# Patient Record
Sex: Male | Born: 1980 | Race: Black or African American | Hispanic: No | Marital: Single | Smoking: Current every day smoker
Health system: Southern US, Community
[De-identification: ages and names within clinical notes are randomized; demographics above are authoritative.]

## PROBLEM LIST (undated history)

## (undated) DIAGNOSIS — S0990XA Unspecified injury of head, initial encounter: Secondary | ICD-10-CM

## (undated) HISTORY — PX: BRAIN SURGERY: SHX531

---

## 2017-03-21 ENCOUNTER — Emergency Department (HOSPITAL_COMMUNITY)
Admission: EM | Admit: 2017-03-21 | Discharge: 2017-03-21 | Disposition: A | Discharge status: Home / Self Care | Payer: No Typology Code available for payment source | Attending: Emergency Medicine | Admitting: Emergency Medicine

## 2017-03-21 ENCOUNTER — Encounter (HOSPITAL_COMMUNITY): Payer: Self-pay | Admitting: Emergency Medicine

## 2017-03-21 ENCOUNTER — Emergency Department (HOSPITAL_COMMUNITY): Payer: No Typology Code available for payment source

## 2017-03-21 ENCOUNTER — Emergency Department (HOSPITAL_COMMUNITY)
Admission: EM | Admit: 2017-03-21 | Discharge: 2017-03-21 | Disposition: A | Discharge status: Home / Self Care | Payer: No Typology Code available for payment source | Source: Home / Self Care

## 2017-03-21 DIAGNOSIS — Y999 Unspecified external cause status: Secondary | ICD-10-CM | POA: Insufficient documentation

## 2017-03-21 DIAGNOSIS — Y9389 Activity, other specified: Secondary | ICD-10-CM | POA: Insufficient documentation

## 2017-03-21 DIAGNOSIS — Z202 Contact with and (suspected) exposure to infections with a predominantly sexual mode of transmission: Secondary | ICD-10-CM | POA: Diagnosis not present

## 2017-03-21 DIAGNOSIS — R51 Headache: Secondary | ICD-10-CM | POA: Insufficient documentation

## 2017-03-21 DIAGNOSIS — S161XXA Strain of muscle, fascia and tendon at neck level, initial encounter: Secondary | ICD-10-CM | POA: Diagnosis not present

## 2017-03-21 DIAGNOSIS — Z5321 Procedure and treatment not carried out due to patient leaving prior to being seen by health care provider: Secondary | ICD-10-CM

## 2017-03-21 DIAGNOSIS — F17210 Nicotine dependence, cigarettes, uncomplicated: Secondary | ICD-10-CM | POA: Diagnosis not present

## 2017-03-21 DIAGNOSIS — S199XXA Unspecified injury of neck, initial encounter: Secondary | ICD-10-CM | POA: Diagnosis present

## 2017-03-21 DIAGNOSIS — Y929 Unspecified place or not applicable: Secondary | ICD-10-CM | POA: Insufficient documentation

## 2017-03-21 HISTORY — DX: Unspecified injury of head, initial encounter: S09.90XA

## 2017-03-21 MED ORDER — NAPROXEN 500 MG PO TABS: 500.0000 mg | ORAL_TABLET | Freq: Two times a day (BID) | ORAL | 0 refills | Status: DC

## 2017-03-21 MED ORDER — CYCLOBENZAPRINE HCL 10 MG PO TABS
10.0000 mg | ORAL_TABLET | Freq: Once | ORAL | Status: AC
  Administered 2017-03-21: 10 mg via ORAL
  Filled 2017-03-21: qty 1

## 2017-03-21 MED ORDER — OXYCODONE-ACETAMINOPHEN 5-325 MG PO TABS
1.0000 | ORAL_TABLET | ORAL | Status: DC | PRN
  Administered 2017-03-21: 1 via ORAL
  Filled 2017-03-21: qty 1

## 2017-03-21 MED ORDER — CEFTRIAXONE SODIUM 250 MG IJ SOLR
250.0000 mg | Freq: Once | INTRAMUSCULAR | Status: AC
  Administered 2017-03-21: 250 mg via INTRAMUSCULAR
  Filled 2017-03-21: qty 250

## 2017-03-21 MED ORDER — IBUPROFEN 200 MG PO TABS
600.0000 mg | ORAL_TABLET | Freq: Once | ORAL | Status: AC
  Administered 2017-03-21: 600 mg via ORAL
  Filled 2017-03-21: qty 3

## 2017-03-21 MED ORDER — STERILE WATER FOR INJECTION IJ SOLN
INTRAMUSCULAR | Status: AC
  Administered 2017-03-21: 10 mL
  Filled 2017-03-21: qty 10

## 2017-03-21 MED ORDER — CYCLOBENZAPRINE HCL 10 MG PO TABS: 10.0000 mg | ORAL_TABLET | Freq: Two times a day (BID) | ORAL | 0 refills | Status: AC | PRN

## 2017-03-21 MED ORDER — AZITHROMYCIN 250 MG PO TABS
1000.0000 mg | ORAL_TABLET | Freq: Once | ORAL | Status: AC
Start: 2017-03-21 — End: 2017-03-21
  Administered 2017-03-21: 1000 mg via ORAL
  Filled 2017-03-21: qty 4
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
## 2017-03-21 NOTE — ED Notes (Signed)
Patient reports he has a ride home. 

## 2017-03-21 NOTE — Discharge Instructions (Signed)
Do not drive while taking the muscle relaxant as it will make you sleepy.  Follow up with the health department for additional STD screening.
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
## 2017-03-22 LAB — HIV ANTIBODY (ROUTINE TESTING W REFLEX): HIV Screen 4th Generation wRfx: NONREACTIVE

## 2017-03-22 LAB — RPR: RPR: NONREACTIVE

## 2017-03-23 LAB — GC/CHLAMYDIA PROBE AMP (~~LOC~~) NOT AT ARMC
CHLAMYDIA, DNA PROBE: NEGATIVE
Neisseria Gonorrhea: NEGATIVE

## 2017-05-05 ENCOUNTER — Emergency Department (HOSPITAL_COMMUNITY): Payer: Self-pay

## 2017-05-05 ENCOUNTER — Emergency Department (HOSPITAL_COMMUNITY)
Admission: EM | Admit: 2017-05-05 | Discharge: 2017-05-05 | Disposition: A | Discharge status: Home / Self Care | Payer: Self-pay | Attending: Emergency Medicine | Admitting: Emergency Medicine

## 2017-05-05 ENCOUNTER — Encounter (HOSPITAL_COMMUNITY): Payer: Self-pay

## 2017-05-05 ENCOUNTER — Other Ambulatory Visit: Payer: Self-pay

## 2017-05-05 DIAGNOSIS — Y929 Unspecified place or not applicable: Secondary | ICD-10-CM | POA: Insufficient documentation

## 2017-05-05 DIAGNOSIS — Y939 Activity, unspecified: Secondary | ICD-10-CM | POA: Insufficient documentation

## 2017-05-05 DIAGNOSIS — M7581 Other shoulder lesions, right shoulder: Secondary | ICD-10-CM

## 2017-05-05 DIAGNOSIS — S46911A Strain of unspecified muscle, fascia and tendon at shoulder and upper arm level, right arm, initial encounter: Secondary | ICD-10-CM | POA: Insufficient documentation

## 2017-05-05 DIAGNOSIS — Y999 Unspecified external cause status: Secondary | ICD-10-CM | POA: Insufficient documentation

## 2017-05-05 DIAGNOSIS — X500XXA Overexertion from strenuous movement or load, initial encounter: Secondary | ICD-10-CM | POA: Insufficient documentation

## 2017-05-05 DIAGNOSIS — M25711 Osteophyte, right shoulder: Secondary | ICD-10-CM | POA: Insufficient documentation

## 2017-05-05 MED ORDER — DICLOFENAC SODIUM 75 MG PO TBEC: 75.0000 mg | DELAYED_RELEASE_TABLET | Freq: Two times a day (BID) | ORAL | 0 refills | Status: AC

## 2017-05-05 MED ORDER — DICLOFENAC SODIUM 75 MG PO TBEC
75.0000 mg | DELAYED_RELEASE_TABLET | Freq: Once | ORAL | Status: DC
  Filled 2017-05-05: qty 1

## 2017-05-05 NOTE — ED Notes (Signed)
Bed: WTR8 Expected date:  Expected time:  Means of arrival:  Comments: 

## 2017-05-05 NOTE — Discharge Instructions (Signed)
You have a bone spur at your ac joint.  Schedule to see the Orthopaedist for evaluation.

## 2017-05-05 NOTE — ED Triage Notes (Signed)
Patient presents with right shoulder pain s/p "lifting something heavy and feeling it pop" a week ago. Patient continues to have limited movement to his right arm, but states he "feels a bump in my shoulder that's not normal."

## 2017-05-05 NOTE — ED Notes (Signed)
Patient upset he is not getting "something stronger for my pain right now." Patient states "I dont need that paperwork if shes not going to give me the medicine I need. Just shred it. Im outta here. I just wasted my time." Patient refused to sign disposition for his paperwork. Patient refused discharge vitals. Patient ambulates from ER without assistance.
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
## 2019-06-04 ENCOUNTER — Encounter (HOSPITAL_COMMUNITY): Payer: Self-pay | Admitting: Emergency Medicine

## 2019-06-04 ENCOUNTER — Other Ambulatory Visit: Payer: Self-pay

## 2019-06-04 ENCOUNTER — Emergency Department (HOSPITAL_COMMUNITY)
Admission: EM | Admit: 2019-06-04 | Discharge: 2019-06-04 | Disposition: A | Discharge status: Home / Self Care | Payer: Self-pay | Attending: Emergency Medicine | Admitting: Emergency Medicine

## 2019-06-04 ENCOUNTER — Emergency Department (HOSPITAL_COMMUNITY): Payer: Self-pay

## 2019-06-04 DIAGNOSIS — S022XXA Fracture of nasal bones, initial encounter for closed fracture: Secondary | ICD-10-CM

## 2019-06-04 DIAGNOSIS — Y999 Unspecified external cause status: Secondary | ICD-10-CM | POA: Insufficient documentation

## 2019-06-04 DIAGNOSIS — F17200 Nicotine dependence, unspecified, uncomplicated: Secondary | ICD-10-CM | POA: Insufficient documentation

## 2019-06-04 DIAGNOSIS — S060X1A Concussion with loss of consciousness of 30 minutes or less, initial encounter: Secondary | ICD-10-CM

## 2019-06-04 DIAGNOSIS — Y939 Activity, unspecified: Secondary | ICD-10-CM | POA: Insufficient documentation

## 2019-06-04 DIAGNOSIS — G44319 Acute post-traumatic headache, not intractable: Secondary | ICD-10-CM

## 2019-06-04 DIAGNOSIS — Y929 Unspecified place or not applicable: Secondary | ICD-10-CM | POA: Insufficient documentation

## 2019-06-04 MED ORDER — ACETAMINOPHEN 325 MG PO TABS
650.0000 mg | ORAL_TABLET | Freq: Once | ORAL | Status: AC
  Administered 2019-06-04: 650 mg via ORAL
  Filled 2019-06-04: qty 2

## 2019-06-04 MED ORDER — HYDROCODONE-ACETAMINOPHEN 5-325 MG PO TABS
1.0000 | ORAL_TABLET | Freq: Once | ORAL | Status: AC
  Administered 2019-06-04: 1 via ORAL
  Filled 2019-06-04: qty 1

## 2019-06-04 MED ORDER — HYDROCODONE-ACETAMINOPHEN 5-325 MG PO TABS
1.0000 | ORAL_TABLET | Freq: Four times a day (QID) | ORAL | 0 refills | Status: AC | PRN
Start: 2019-06-04 — End: ?
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
## 2019-06-04 NOTE — ED Notes (Signed)
Patient transported to CT 
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
## 2019-06-04 NOTE — Discharge Instructions (Addendum)
Today you received medications that may make you sleepy or impair your ability to make decisions.  For the next 24 hours please do not drive, operate heavy machinery, care for a small child with out another adult present, or perform any activities that may cause harm to you or someone else if you were to fall asleep or be impaired.   You are being prescribed a medication which may make you sleepy. Please follow up of listed precautions for at least 24 hours after taking one dose.  All narcotic medications are addictive and no one thinks that they are going to get addicted. Narcotic medications can also cause you to become constipated.  Please take Ibuprofen (Advil, motrin) and Tylenol (acetaminophen) to relieve your pain.  You may take up to 600 MG (3 pills) of normal strength ibuprofen every 8 hours as needed.  In between doses of ibuprofen you make take tylenol, up to 1,000 mg (two extra strength pills).  Do not take more than 3,000 mg tylenol in a 24 hour period.  Please check all medication labels as many medications such as pain and cold medications may contain tylenol.  Do not drink alcohol while taking these medications.  Do not take other NSAID'S while taking ibuprofen (such as aleve or naproxen).  Please take ibuprofen with food to decrease stomach upset.  While in the ED your blood pressure was high.  Please follow up with your primary care doctor or the wellness clinic for repeat evaluation as you may need medication.  High blood pressure can cause long term, potentially serious, damage if left untreated. Eating diet high in sodium/salt, and specific medications can cause high blood pressure including stimulants such as caffeine, nasal decongestant medications, and recreational substances such as cocaine and amphetamines.

## 2019-10-31 ENCOUNTER — Emergency Department (HOSPITAL_COMMUNITY): Payer: Self-pay

## 2019-10-31 ENCOUNTER — Emergency Department (HOSPITAL_COMMUNITY)
Admission: EM | Admit: 2019-10-31 | Discharge: 2019-10-31 | Disposition: A | Discharge status: Home / Self Care | Payer: Self-pay | Attending: Emergency Medicine | Admitting: Emergency Medicine

## 2019-10-31 ENCOUNTER — Other Ambulatory Visit: Payer: Self-pay

## 2019-10-31 ENCOUNTER — Encounter (HOSPITAL_COMMUNITY): Payer: Self-pay

## 2019-10-31 DIAGNOSIS — M545 Low back pain, unspecified: Secondary | ICD-10-CM

## 2019-10-31 DIAGNOSIS — F17210 Nicotine dependence, cigarettes, uncomplicated: Secondary | ICD-10-CM | POA: Insufficient documentation

## 2019-10-31 MED ORDER — METHOCARBAMOL 500 MG PO TABS: 500.0000 mg | ORAL_TABLET | Freq: Three times a day (TID) | ORAL | 0 refills | Status: AC | PRN

## 2019-10-31 MED ORDER — NAPROXEN 375 MG PO TABS: 375.0000 mg | ORAL_TABLET | Freq: Two times a day (BID) | ORAL | 0 refills | Status: AC

## 2019-10-31 MED ORDER — NAPROXEN 250 MG PO TABS
500.0000 mg | ORAL_TABLET | Freq: Once | ORAL | Status: AC
  Administered 2019-10-31: 500 mg via ORAL
  Filled 2019-10-31: qty 2

## 2019-10-31 MED ORDER — ACETAMINOPHEN 325 MG PO TABS
650.0000 mg | ORAL_TABLET | Freq: Once | ORAL | Status: AC
  Administered 2019-10-31: 650 mg via ORAL
  Filled 2019-10-31: qty 2

## 2019-10-31 NOTE — ED Provider Notes (Signed)
[facility] EMERGENCY DEPARTMENT Provider Note   CSN: [id number] Arrival date & time: 10/31/19  1125     History Chief Complaint  Patient presents with  . Back Pain    Jeremy Mcgrath is a 39 y.o. male.  HPI HPI Comments: Jeremy Mcgrath is a 39 y.o. male who presents to the Emergency Department complaining of low back pain.  About 3 days ago patient was "dead lifting" and felt a sudden onset of low back pain.  His pain worsens with any movement or ambulation.  Patient has taken Tylenol as well as Flexeril with minimal relief.  He denies any numbness, tingling, bowel or bladder incontinence, difficulty urinating, saddle anesthesia.     Past Medical History:  Diagnosis Date  . Head injury     There are no problems to display for this patient.   Past Surgical History:  Procedure Laterality Date  . BRAIN SURGERY         Family History  Problem Relation Age of Onset  . Hypertension Other     Social History   Tobacco Use  . Smoking status: Current Every Day Smoker  . Smokeless tobacco: Never Used  Substance Use Topics  . Alcohol use: Yes  . Drug use: No    Home Medications Prior to Admission medications   Medication Sig Start Date End Date Taking? Authorizing Provider  cyclobenzaprine (FLEXERIL) 10 MG tablet Take 1 tablet (10 mg total) by mouth 2 (two) times daily as needed for muscle spasms. 03/21/17   Janne Napoleon, NP  diclofenac (VOLTAREN) 75 MG EC tablet Take 1 tablet (75 mg total) by mouth 2 (two) times daily. 05/05/17   Elson Areas, PA-C  HYDROcodone-acetaminophen (NORCO/VICODIN) 5-325 MG tablet Take 1 tablet by mouth every 6 (six) hours as needed for severe pain. 06/04/19   Cristina Gong, PA-C  naproxen (NAPROSYN) 500 MG tablet Take 1 tablet (500 mg total) by mouth 2 (two) times daily. 03/21/17   Janne Napoleon, NP    Allergies    Patient has no known allergies.  Review of Systems   Review of Systems  Genitourinary:  Negative for decreased urine volume, difficulty urinating and frequency.  Musculoskeletal: Positive for back pain and myalgias.  Skin: Negative for color change and wound.  Neurological: Negative for weakness and numbness.    Physical Exam Updated Vital Signs BP 124/77 (BP Location: Right Arm)   Pulse 92   Temp 98.2 F (36.8 C) (Oral)   Resp 20   Ht 5\' 7"  (1.702 m)   Wt 63.5 kg   SpO2 96%   BMI 21.93 kg/m   Physical Exam Vitals and nursing note reviewed.  Constitutional:      General: He is not in acute distress.    Appearance: Normal appearance. He is well-developed and normal weight.  HENT:     Head: Normocephalic and atraumatic.     Right Ear: External ear normal.     Left Ear: External ear normal.     Nose: Nose normal.     Mouth/Throat:     Pharynx: Oropharynx is clear.  Eyes:     General: No scleral icterus.       Right eye: No discharge.        Left eye: No discharge.     Extraocular Movements: Extraocular movements intact.     Conjunctiva/sclera: Conjunctivae normal.  Neck:     Trachea: No tracheal deviation.     Comments: No midline cervical  spinal tenderness.  Full range of motion of the cervical spine. Cardiovascular:     Rate and Rhythm: Normal rate.     Pulses: Normal pulses.     Comments: Palpable pedal pulses bilaterally Pulmonary:     Effort: Pulmonary effort is normal. No respiratory distress.     Breath sounds: No stridor.  Abdominal:     General: Abdomen is flat. There is no distension.     Palpations: Abdomen is soft.     Tenderness: There is no abdominal tenderness.  Musculoskeletal:        General: Tenderness present. No swelling or deformity.     Cervical back: Normal range of motion and neck supple. No tenderness.     Comments: Mild diffuse midline lumbar spine as well as moderate diffuse lumbar paraspinal tenderness noted bilaterally.  No erythema or edema visualized.  Unable to assess range of motion of the lumbar spine secondary to  pain.  Negative straight leg raise.  Negative contralateral straight leg raise.  Skin:    General: Skin is warm and dry.     Findings: No rash.  Neurological:     General: No focal deficit present.     Mental Status: He is alert and oriented to person, place, and time.     Cranial Nerves: Cranial nerve deficit: no gross deficits.     Comments: Distal sensation intact in the bilateral lower extremities.  Strength is 5 out of 5 in the bilateral lower extremities.  2+ patellar DTRs noted bilaterally.  Patient able to ambulate slowly but with a normal gait.  Psychiatric:        Mood and Affect: Mood normal.        Behavior: Behavior normal.    ED Results / Procedures / Treatments   Labs (all labs ordered are listed, but only abnormal results are displayed) Labs Reviewed - No data to display  EKG None  Radiology DG Lumbar Spine Complete  Result Date: 10/31/2019 CLINICAL DATA:  Low back pain for several days, initial encounter EXAM: LUMBAR SPINE - COMPLETE 4+ VIEW COMPARISON:  None. FINDINGS: Five lumbar type vertebral bodies are well visualized. Vertebral body height is well maintained. No pars defects are noted. No anterolisthesis is seen. No soft tissue abnormality is noted. IMPRESSION: No acute abnormality noted. Electronically Signed   By: Alcide Clever M.D.   On: 10/31/2019 15:05    Procedures Procedures (including critical care time)  Medications Ordered in ED Medications  naproxen (NAPROSYN) tablet 500 mg (has no administration in time range)  acetaminophen (TYLENOL) tablet 650 mg (has no administration in time range)    ED Course  I have reviewed the triage vital signs and the nursing notes.  Pertinent labs & imaging results that were available during my care of the patient were reviewed by me and considered in my medical decision making (see chart for details).    MDM Rules/Calculators/A&P                      Patient is a 39 year old male that presents with lumbar  strain.  No concerning symptoms for sciatica or cauda equina.  X-rays were obtained of the lower back showing no acute abnormalities in the lumbar region.  Patient given Tylenol and naproxen in the emergency department and he notes some moderate relief of his pain.  We discussed continued pain management with naproxen as well as Robaxin.  I will provide a short prescription for both.  Also recommended  heat and range of motion exercises as pain permits.  Hot baths.  Application of topical pain relievers.  Patient understands to return to the emergency department if his symptoms worsen.  His questions were answered and he was amicable at the time of discharge.  His vital signs are stable.  Patient discharged to home/self care.  Condition at discharge: Stable  Note: Portions of this report may have been transcribed using voice recognition software. Every effort was made to ensure accuracy; however, inadvertent computerized transcription errors may be present.    Final Clinical Impression(s) / ED Diagnoses Final diagnoses:  Acute bilateral low back pain without sciatica    Rx / DC Orders ED Discharge Orders         Ordered    naproxen (NAPROSYN) 375 MG tablet  2 times daily     10/31/19 1524    methocarbamol (ROBAXIN) 500 MG tablet  Every 8 hours PRN     10/31/19 1524           Rayna Sexton, PA-C 10/31/19 1528    Veryl Speak, MD 11/02/19 [phone]

## 2019-10-31 NOTE — Discharge Instructions (Signed)
Per our discussion, I am prescribing you 2 new medications.  The first medication is called naproxen.  You can take this twice a day for management of your pain.  Please be sure to eat a small amount of food when taking this medication.  The next medication is called Robaxin.  This is a muscle relaxer.  I know you had previously taken a Flexeril.  Please do not take any Flexeril while you are also taking this medication.  You can use it as needed for management of your pain and stiffness in the lower back.  This medication can also have a sedating effect so do not operate a motor vehicle after taking it or mix it with alcohol.  Do not exceed more than 3 doses in a day.  I would also recommend applying ice and heat to the lower back.  Continue to move around and stretch as your pain permits.  If your symptoms worsen please do not hesitate to return to the emergency department for reevaluation.  It was a pleasure to meet you.

## 2019-10-31 NOTE — ED Triage Notes (Signed)
Pt states he thinks he pulled a muscle in his lower back a couple days ago, pt reports he took his dads muscle relaxers without relief of pain, pt ambulatory.
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
